# Patient Record
Sex: Female | Born: 1955 | Race: White | Hispanic: No | Marital: Married | Smoking: Former smoker
Health system: Southern US, Community
[De-identification: ages and names within clinical notes are randomized; demographics above are authoritative.]

## PROBLEM LIST (undated history)

## (undated) DIAGNOSIS — IMO0002 Reserved for concepts with insufficient information to code with codable children: Secondary | ICD-10-CM

## (undated) DIAGNOSIS — N979 Female infertility, unspecified: Secondary | ICD-10-CM

## (undated) HISTORY — DX: Reserved for concepts with insufficient information to code with codable children: IMO0002

## (undated) HISTORY — DX: Female infertility, unspecified: N97.9

---

## 1997-02-25 ENCOUNTER — Ambulatory Visit (HOSPITAL_COMMUNITY): Admission: RE | Admit: 1997-02-25 | Discharge: 1997-02-25 | Payer: Self-pay | Admitting: *Deleted

## 1999-01-26 ENCOUNTER — Ambulatory Visit (HOSPITAL_COMMUNITY): Admission: RE | Admit: 1999-01-26 | Discharge: 1999-01-26 | Payer: Self-pay | Admitting: *Deleted

## 1999-01-26 ENCOUNTER — Encounter: Payer: Self-pay | Admitting: *Deleted

## 2000-10-30 ENCOUNTER — Encounter: Payer: Self-pay | Admitting: *Deleted

## 2000-10-30 ENCOUNTER — Ambulatory Visit (HOSPITAL_COMMUNITY): Admission: RE | Admit: 2000-10-30 | Discharge: 2000-10-30 | Payer: Self-pay | Admitting: *Deleted

## 2000-11-01 ENCOUNTER — Encounter: Payer: Self-pay | Admitting: *Deleted

## 2000-11-01 ENCOUNTER — Encounter: Admission: RE | Admit: 2000-11-01 | Discharge: 2000-11-01 | Payer: Self-pay | Admitting: *Deleted

## 2001-03-21 ENCOUNTER — Encounter: Admission: RE | Admit: 2001-03-21 | Discharge: 2001-03-21 | Payer: Self-pay | Admitting: *Deleted

## 2001-03-21 ENCOUNTER — Encounter: Payer: Self-pay | Admitting: *Deleted

## 2002-12-05 ENCOUNTER — Ambulatory Visit (HOSPITAL_COMMUNITY): Admission: RE | Admit: 2002-12-05 | Discharge: 2002-12-05 | Payer: Self-pay

## 2003-12-07 ENCOUNTER — Ambulatory Visit (HOSPITAL_COMMUNITY): Admission: RE | Admit: 2003-12-07 | Discharge: 2003-12-07 | Payer: Self-pay | Admitting: Family Medicine

## 2004-07-25 ENCOUNTER — Other Ambulatory Visit: Admission: RE | Admit: 2004-07-25 | Discharge: 2004-07-25 | Payer: Self-pay | Admitting: Family Medicine

## 2005-01-31 ENCOUNTER — Ambulatory Visit (HOSPITAL_COMMUNITY): Admission: RE | Admit: 2005-01-31 | Discharge: 2005-01-31 | Payer: Self-pay | Admitting: Family Medicine

## 2006-03-26 ENCOUNTER — Ambulatory Visit (HOSPITAL_COMMUNITY): Admission: RE | Admit: 2006-03-26 | Discharge: 2006-03-26 | Payer: Self-pay | Admitting: Family Medicine

## 2006-04-04 ENCOUNTER — Encounter: Admission: RE | Admit: 2006-04-04 | Discharge: 2006-04-04 | Payer: Self-pay | Admitting: Family Medicine

## 2010-01-02 HISTORY — PX: POLYPECTOMY: SHX149

## 2011-06-03 HISTORY — PX: FOOT SURGERY: SHX648

## 2012-01-03 HISTORY — PX: BUNIONECTOMY: SHX129

## 2012-11-01 ENCOUNTER — Ambulatory Visit (INDEPENDENT_AMBULATORY_CARE_PROVIDER_SITE_OTHER): Payer: BC Managed Care – PPO | Admitting: Gynecology

## 2012-11-01 ENCOUNTER — Encounter: Payer: Self-pay | Admitting: Gynecology

## 2012-11-01 VITALS — BP 110/68 | HR 68 | Resp 16 | Wt 124.0 lb

## 2012-11-01 DIAGNOSIS — N952 Postmenopausal atrophic vaginitis: Secondary | ICD-10-CM

## 2012-11-01 DIAGNOSIS — IMO0002 Reserved for concepts with insufficient information to code with codable children: Secondary | ICD-10-CM

## 2012-11-01 MED ORDER — ESTRADIOL 2 MG VA RING: 2.0000 mg | VAGINAL_RING | VAGINAL | Status: DC

## 2012-11-01 NOTE — Progress Notes (Signed)
57 y.o. married Caucasian female   G2P2002 here for annual exam. LMP 2011.  She does report hot flashes, does have night sweats, does have vaginal dryness.  She is using lubricants-astroglide.  She does not report post-menopasual bleeding.  LMP 4y ago, was on hormones-vaginal estrace but issues with compliance but no oral treatments.  Pt is interested in estring.  Patient's last menstrual period was 01/02/2009.          Sexually active: yes  The current method of family planning is vasectomy.    Exercising: yes  teaches fitness class Last pap: 2/14 Abnormal PAP: none Mammogram: 2/14 BSE: done occ Colonoscopy: 8/14 DEXA: 2008 Alcohol: 1 a day Tobacco: none  No health maintenance topics applied.  Family History  Problem Relation Age of Onset  . Cancer Mother     melanoma  . Heart disease Father     There are no active problems to display for this patient.   Past Medical History  Diagnosis Date  . Dyspareunia   . Infertility, female     Past Surgical History  Procedure Laterality Date  . Bunionectomy  2014  . Foot surgery  06/2011    right foot  . Polypectomy  2012    uterine polyp removed    Allergies: Review of patient's allergies indicates no known allergies.  No current outpatient prescriptions on file.   No current facility-administered medications for this visit.    ROS: Pertinent items are noted in HPI.  Exam:    BP 110/68  Pulse 68  Resp 16  Wt 124 lb (56.246 kg)  LMP 01/02/2009 Weight change: @WEIGHTCHANGE @ Last 3 height recordings:  Ht Readings from Last 3 Encounters:  No data found for Ht   General appearance: alert, cooperative and appears stated age Head: Normocephalic, without obvious abnormality, atraumatic Breasts: Inspection negative, No nipple retraction or dimpling, No axillary or supraclavicular adenopathy, Normal to palpation without dominant masses Extremities: left foot in boot due to recent bunion surgery Skin: Skin color, texture,  turgor normal. No rashes or lesions Lymph nodes: Cervical, supraclavicular, and axillary nodes normal. no inguinal nodes palpated Neurologic: Grossly normal   Pelvic: External genitalia:  no lesions              Urethra: normal appearing urethra with no masses, tenderness or lesions              Bartholins and Skenes: normal                 Vagina: atrophic, pale, smooth, no petechiae              Cervix: normal appearance              Pap taken: no        Bimanual Exam:  Uterus:  uterus is normal size, shape, consistency and nontender, retroverted                                      Adnexa:    no masses                                      Rectovaginal: Confirms  Anus:  normal sphincter tone, no lesions  A: well woman Vaginal atrophy dyspareunia no contraindication to begin hormonal therapy      P: discussed alternative to estrace cream including vaginal pills, rings and oral options, in addition recommend EVOO or cocoanut oil. pt prefers the estring, instructed on use Due for annual in 02/2013, will transfer records and reassess medication at that time Discussed PAP guideline changes, importance of weight bearing exercises, calcium, vit D and balanced diet.  An After Visit Summary was printed and given to the patient.

## 2012-11-07 ENCOUNTER — Other Ambulatory Visit: Payer: Self-pay

## 2013-03-26 ENCOUNTER — Ambulatory Visit (INDEPENDENT_AMBULATORY_CARE_PROVIDER_SITE_OTHER): Payer: BC Managed Care – PPO | Admitting: Gynecology

## 2013-03-26 ENCOUNTER — Encounter: Payer: Self-pay | Admitting: Gynecology

## 2013-03-26 VITALS — BP 115/87 | HR 87 | Resp 14 | Ht 63.0 in | Wt 126.0 lb

## 2013-03-26 DIAGNOSIS — N952 Postmenopausal atrophic vaginitis: Secondary | ICD-10-CM

## 2013-03-26 DIAGNOSIS — Z01419 Encounter for gynecological examination (general) (routine) without abnormal findings: Secondary | ICD-10-CM

## 2013-03-26 DIAGNOSIS — IMO0002 Reserved for concepts with insufficient information to code with codable children: Secondary | ICD-10-CM

## 2013-03-26 MED ORDER — ESTRADIOL 2 MG VA RING: 2.0000 mg | VAGINAL_RING | VAGINAL | Status: DC

## 2013-03-26 NOTE — Patient Instructions (Signed)

## 2013-03-26 NOTE — Progress Notes (Signed)
58 y.o. Married Caucasian female   G2P2002 here for annual exam. Pt reports menses are absent due to Menopause.  She does report hot flashes, does have night sweats, does not have vaginal dryness.  She is using lubricants, Estring .  She does not report post-menopasual bleeding.  Pt was started on estring in October 2014, she is very pleased and reports dyspareunia is much better.    Patient's last menstrual period was 01/02/2009.          Sexually active: yes  The current method of family planning is post menopausal status.    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 Pap Smear  11/07/1972  . Tetanus/tdap  11/07/1973  . Colonoscopy  11/07/2004  . Mammogram  04/03/2008  . Influenza Vaccine  08/02/2012    Family History  Problem Relation Age of Onset  . Cancer Mother     melanoma  . Heart disease Father     There are no active problems to display for this patient.   Past Medical History  Diagnosis Date  . Dyspareunia   . Infertility, female     Past Surgical History  Procedure Laterality Date  . Bunionectomy  2014  . Foot surgery  06/2011    right foot  . Polypectomy  2012    uterine polyp removed    Allergies: Review of patient's allergies indicates no known allergies.  Current Outpatient Prescriptions  Medication Sig Dispense Refill  . estradiol (ESTRING) 2 MG vaginal ring Place 2 mg vaginally every 3 (three) months. Insert a new ring into vagina every 3 months  1 each  1   No current facility-administered medications for this visit.    ROS: Pertinent items are noted in HPI.  Exam:    BP 115/87  Pulse 87  Resp 14  Ht 5\' 3"  (1.6 m)  Wt 126 lb (57.153 kg)  BMI 22.33 kg/m2  LMP 01/02/2009 Weight  change: @WEIGHTCHANGE @ Last 3 height recordings:  Ht Readings from Last 3 Encounters:  03/26/13 5\' 3"  (1.6 m)   General appearance: alert, cooperative and appears stated age Head: Normocephalic, without obvious abnormality, atraumatic Neck: no adenopathy, no carotid bruit, no JVD, supple, symmetrical, trachea midline and thyroid not enlarged, symmetric, no tenderness/mass/nodules Lungs: clear to auscultation bilaterally Breasts: normal appearance, no masses or tenderness Heart: regular rate and rhythm, S1, S2 normal, no murmur, click, rub or gallop Abdomen: soft, non-tender; bowel sounds normal; no masses,  no organomegaly Extremities: extremities normal, atraumatic, no cyanosis or edema Skin: Skin color, texture, turgor normal. No rashes or lesions Lymph nodes: Cervical, supraclavicular, and axillary nodes normal. no inguinal nodes palpated Neurologic: Grossly normal   Pelvic: External genitalia:  no lesions              Urethra: normal appearing urethra with no masses, tenderness or lesions              Bartholins and Skenes: Bartholin's, Urethra, Skene's normal                 Vagina: normal appearing vagina with normal color and discharge, no lesions              Cervix: normal appearance  Pap taken: no        Bimanual Exam:  Uterus:  uterus is normal size, shape, consistency and nontender                                      Adnexa:    normal adnexa in size, nontender and no masses                                      Rectovaginal: Confirms                                      Anus:  normal sphincter tone, no lesions  A: well woman no contraindication to continue hormonal therapy     P: mammogram pap smear next year Refill estring counseled on breast self exam, mammography screening, adequate intake of calcium and vitamin D, diet and exercise return annually or prn Discussed PAP guideline changes, importance of weight bearing exercises, calcium, vit D and  balanced diet.  An After Visit Summary was printed and given to the patient.

## 2013-10-06 ENCOUNTER — Other Ambulatory Visit: Payer: Self-pay

## 2013-10-06 DIAGNOSIS — Z1239 Encounter for other screening for malignant neoplasm of breast: Secondary | ICD-10-CM

## 2013-10-16 ENCOUNTER — Ambulatory Visit
Admission: RE | Admit: 2013-10-16 | Discharge: 2013-10-16 | Disposition: A | Discharge status: Home / Self Care | Payer: BC Managed Care – PPO | Source: Ambulatory Visit

## 2013-10-16 DIAGNOSIS — Z1239 Encounter for other screening for malignant neoplasm of breast: Secondary | ICD-10-CM

## 2013-11-03 ENCOUNTER — Encounter: Payer: Self-pay | Admitting: Gynecology

## 2013-12-02 ENCOUNTER — Telehealth: Payer: Self-pay

## 2013-12-02 NOTE — Telephone Encounter (Signed)
lmtcb to reschedule AEX with Dr. Lathrop 

## 2014-04-01 ENCOUNTER — Ambulatory Visit: Payer: BC Managed Care – PPO | Admitting: Gynecology

## 2014-04-02 ENCOUNTER — Encounter: Payer: Self-pay | Admitting: Nurse Practitioner

## 2014-04-02 ENCOUNTER — Ambulatory Visit (INDEPENDENT_AMBULATORY_CARE_PROVIDER_SITE_OTHER): Payer: BLUE CROSS/BLUE SHIELD | Admitting: Nurse Practitioner

## 2014-04-02 VITALS — BP 112/76 | HR 64 | Ht 63.25 in | Wt 128.0 lb

## 2014-04-02 DIAGNOSIS — Z01419 Encounter for gynecological examination (general) (routine) without abnormal findings: Secondary | ICD-10-CM | POA: Diagnosis not present

## 2014-04-02 DIAGNOSIS — N952 Postmenopausal atrophic vaginitis: Secondary | ICD-10-CM

## 2014-04-02 DIAGNOSIS — Z0000 Encounter for general adult medical examination without abnormal findings: Secondary | ICD-10-CM

## 2014-04-02 DIAGNOSIS — IMO0002 Reserved for concepts with insufficient information to code with codable children: Secondary | ICD-10-CM

## 2014-04-02 DIAGNOSIS — N941 Dyspareunia: Secondary | ICD-10-CM | POA: Diagnosis not present

## 2014-04-02 LAB — POCT URINALYSIS DIPSTICK
BILIRUBIN UA: NEGATIVE
Blood, UA: NEGATIVE
GLUCOSE UA: NEGATIVE
KETONES UA: NEGATIVE
LEUKOCYTES UA: NEGATIVE
Nitrite, UA: NEGATIVE
Protein, UA: NEGATIVE
Urobilinogen, UA: NEGATIVE
pH, UA: 5.5

## 2014-04-02 MED ORDER — ESTRADIOL 2 MG VA RING: 2.0000 mg | VAGINAL_RING | VAGINAL | Status: DC

## 2014-04-02 NOTE — Progress Notes (Signed)
Patient ID: Hannah Ewing, female   DOB: 12/27/1955, 59 y.o.   MRN: [id number] 59 y.o. 582P2002 Married  Caucasian Fe here for annual exam.  Does well with Estring and wants to continue.  Patient's last menstrual period was 01/02/2009.          Sexually active: Yes.    The current method of family planning is post menopausal status and vasectomy.    Exercising: Yes.    Pt gets exercise everyday by playing tennis, cardio, weights, teaches exercise classes at [employer] Smoker:  no  Health Maintenance: Pap:  02/2012, negative, no history of abnormal pap MMG:  10/16/13, Bi-Rads 1:  Negative Colonoscopy:  08/2012, normal, repeat in 10 years BMD:   04/2013, normal (part of alzheimer's research study) TDaP:  ? Labs:  HB:  13.3  Urine:  Negative    reports that she has quit smoking. She does not have any smokeless tobacco history on file. She reports that she drinks about 3.5 oz of alcohol per week. She reports that she does not use illicit drugs.  Past Medical History  Diagnosis Date  . Dyspareunia   . Infertility, female     Past Surgical History  Procedure Laterality Date  . Bunionectomy  2014  . Foot surgery  06/2011    right foot  . Polypectomy  2012    uterine polyp removed    Current Outpatient Prescriptions  Medication Sig Dispense Refill  . estradiol (ESTRING) 2 MG vaginal ring Place 2 mg vaginally every 3 (three) months. Insert a new ring into vagina every 3 months 1 each 3   No current facility-administered medications for this visit.    Family History  Problem Relation Age of Onset  . Cancer Mother     melanoma  . Hyperlipidemia Mother   . Hypertension Mother   . Heart disease Father     CABG x 3  . Heart disease Maternal Grandmother   . Heart attack Maternal Grandmother     x 2  . AAA (abdominal aortic aneurysm) Brother   . Arrhythmia Brother     ablation  . Hyperlipidemia Sister   . Hypertension Sister     ROS:  Pertinent items are noted in HPI.  Otherwise, a  comprehensive ROS was negative.  Exam:   BP 112/76 mmHg  Pulse 64  Ht 5' 3.25" (1.607 m)  Wt 128 lb (58.06 kg)  BMI 22.48 kg/m2  LMP 01/02/2009 Height: 5' 3.25" (160.7 cm) Ht Readings from Last 3 Encounters:  04/02/14 5' 3.25" (1.607 m)  03/26/13 5\' 3"  (1.6 m)    General appearance: alert, cooperative and appears stated age Head: Normocephalic, without obvious abnormality, atraumatic Neck: no adenopathy, supple, symmetrical, trachea midline and thyroid normal to inspection and palpation Lungs: clear to auscultation bilaterally Breasts: normal appearance, no masses or tenderness Heart: regular rate and rhythm Abdomen: soft, non-tender; no masses,  no organomegaly Extremities: extremities normal, atraumatic, no cyanosis or edema Skin: Skin color, texture, turgor normal. No rashes or lesions Lymph nodes: Cervical, supraclavicular, and axillary nodes normal. No abnormal inguinal nodes palpated Neurologic: Grossly normal   Pelvic: External genitalia:  no lesions              Urethra:  normal appearing urethra with no masses, tenderness or lesions              Bartholin's and Skene's: normal                 Vagina:  normal appearing vagina with normal color and discharge, no lesions              Cervix: anteverted              Pap taken: No. Bimanual Exam:  Uterus:  normal size, contour, position, consistency, mobility, non-tender              Adnexa: no mass, fullness, tenderness               Rectovaginal: Confirms               Anus:  normal sphincter tone, no lesions  Chaperone present:  yes  A:  Well Woman with normal exam  Postmenopausal - no HRT  Atrophic vaginitis - does well on Estring  [facility] for heart disease   P:   Reviewed health and wellness pertinent to exam  Pap smear not taken today  Mammogram is due 10/16  Refill on Estring for a year  Counseled with risk of DVT, CVA, cancer, etc  Counseled on breast self exam, mammography screening, adequate intake of  calcium and vitamin D, diet and exercise return annually or prn  An After Visit Summary was printed and given to the patient  She will try to get BMD results to [place] from last year.

## 2014-04-02 NOTE — Patient Instructions (Signed)

## 2014-04-02 NOTE — Progress Notes (Signed)
Encounter reviewed by Dr. Layani Foronda Silva.  

## 2014-04-03 LAB — VITAMIN D 25 HYDROXY (VIT D DEFICIENCY, FRACTURES): VIT D 25 HYDROXY: 39 ng/mL (ref 30–100)

## 2014-04-03 LAB — HEMOGLOBIN, FINGERSTICK: Hemoglobin, fingerstick: 13.3 g/dL (ref 12.0–16.0)

## 2015-03-29 ENCOUNTER — Other Ambulatory Visit: Payer: Self-pay

## 2015-03-29 DIAGNOSIS — Z1231 Encounter for screening mammogram for malignant neoplasm of breast: Secondary | ICD-10-CM

## 2015-04-05 ENCOUNTER — Ambulatory Visit (INDEPENDENT_AMBULATORY_CARE_PROVIDER_SITE_OTHER): Payer: BLUE CROSS/BLUE SHIELD | Admitting: Nurse Practitioner

## 2015-04-05 ENCOUNTER — Encounter: Payer: Self-pay | Admitting: Nurse Practitioner

## 2015-04-05 VITALS — BP 120/78 | HR 64 | Ht 63.0 in | Wt 126.0 lb

## 2015-04-05 DIAGNOSIS — R8290 Unspecified abnormal findings in urine: Secondary | ICD-10-CM | POA: Diagnosis not present

## 2015-04-05 DIAGNOSIS — Z0000 Encounter for general adult medical examination without abnormal findings: Secondary | ICD-10-CM | POA: Diagnosis not present

## 2015-04-05 DIAGNOSIS — N952 Postmenopausal atrophic vaginitis: Secondary | ICD-10-CM

## 2015-04-05 DIAGNOSIS — Z01419 Encounter for gynecological examination (general) (routine) without abnormal findings: Secondary | ICD-10-CM | POA: Diagnosis not present

## 2015-04-05 LAB — HIV ANTIBODY (ROUTINE TESTING W REFLEX): HIV 1&2 Ab, 4th Generation: NONREACTIVE

## 2015-04-05 LAB — HEPATITIS C ANTIBODY: HCV Ab: NEGATIVE

## 2015-04-05 LAB — POCT URINALYSIS DIPSTICK
BILIRUBIN UA: NEGATIVE
Blood, UA: NEGATIVE
Glucose, UA: NEGATIVE
KETONES UA: NEGATIVE
Nitrite, UA: NEGATIVE
Protein, UA: NEGATIVE
Urobilinogen, UA: NEGATIVE
pH, UA: 6

## 2015-04-05 MED ORDER — ESTRADIOL 0.1 MG/GM VA CREA: TOPICAL_CREAM | VAGINAL | Status: DC

## 2015-04-05 NOTE — Progress Notes (Signed)
Patient ID: Hannah Ewing, female   DOB: 05/21/1955, 60 y.o.   MRN: [id number]  60 y.o. 732P2002 Married  Caucasian Fe here for annual exam.  Some vaso symptoms that are tolerable. Overall feels well and she and husband travel a lot.  Denies urinary symptoms.  Patient's last menstrual period was 01/02/2009 (approximate).          Sexually active: Yes.    The current method of family planning is vasectomy.    Exercising: Yes.  tennis, weight training and cardio Smoker:  no  Health Maintenance: Pap: 02/2012, negative, no history of abnormal pap MMG: 10/16/13, Bi-Rads 1: Negative, apt scheduled 04/14/15 Colonoscopy: 08/2012, normal, repeat in 10 years BMD: 04/2013, normal (part of Alzheimer's research study) TDaP: ?, over 10 years Shingles: Never Pneumonia: Not indicated due to age Hep C and HIV: done today Labs: HB: 13.7  Urine:  1+ leuk's   reports that she has quit smoking. She has never used smokeless tobacco. She reports that she drinks about 2.4 - 3.0 oz of alcohol per week. She reports that she does not use illicit drugs.  Past Medical History  Diagnosis Date  . Dyspareunia   . Infertility, female     Past Surgical History  Procedure Laterality Date  . Bunionectomy  2014  . Foot surgery  06/2011    right foot  . Polypectomy  2012    uterine polyp removed    Current Outpatient Prescriptions  Medication Sig Dispense Refill  . estradiol (ESTRACE) 0.1 MG/GM vaginal cream Use 1/2 g vaginally and pea size amount as directed 42.5 g 3   No current facility-administered medications for this visit.    Family History  Problem Relation Age of Onset  . Cancer Mother     melanoma  . Hyperlipidemia Mother   . Hypertension Mother   . Heart disease Father     CABG x 3  . Heart disease Maternal Grandmother   . Heart attack Maternal Grandmother     x 2  . AAA (abdominal aortic aneurysm) Brother   . Arrhythmia Brother     ablation  . Hyperlipidemia Sister   . Hypertension  Sister     ROS:  Pertinent items are noted in HPI.  Otherwise, a comprehensive ROS was negative.  Exam:   BP 120/78 mmHg  Pulse 64  Ht 5\' 3"  (1.6 m)  Wt 126 lb (57.153 kg)  BMI 22.33 kg/m2  LMP 01/02/2009 (Approximate) Height: 5\' 3"  (160 cm) Ht Readings from Last 3 Encounters:  04/05/15 5\' 3"  (1.6 m)  04/02/14 5' 3.25" (1.607 m)  03/26/13 5\' 3"  (1.6 m)    General appearance: alert, cooperative and appears stated age Head: Normocephalic, without obvious abnormality, atraumatic Neck: no adenopathy, supple, symmetrical, trachea midline and thyroid normal to inspection and palpation Lungs: clear to auscultation bilaterally Breasts: normal appearance, no masses or tenderness Heart: regular rate and rhythm Abdomen: soft, non-tender; no masses,  no organomegaly Extremities: extremities normal, atraumatic, no cyanosis or edema Skin: Skin color, texture, turgor normal. No rashes or lesions Lymph nodes: Cervical, supraclavicular, and axillary nodes normal. No abnormal inguinal nodes palpated Neurologic: Grossly normal   Pelvic: External genitalia:  no lesions              Urethra:  atrophic appearing urethra with no masses, tenderness or lesions              Bartholin's and Skene's: normal  Vagina: very atrophic appearing vagina with normal color and discharge, no lesions              Cervix: anteverted              Pap taken: Yes.   Bimanual Exam:  Uterus:  normal size, contour, position, consistency, mobility, non-tender              Adnexa: no mass, fullness, tenderness               Rectovaginal: Confirms               Anus:  normal sphincter tone, no lesions  Chaperone present: yes  A:  Well Woman with normal exam  Postmenopausal - no HRT Atrophic vaginitis - now off Estring X 6 months [facility] for heart disease  R/O UTI   P:   Reviewed health and wellness pertinent to exam  Pap smear as above  Mammogram is due and scheduled for  04/14/15  Follow with labs and urine culture  She is given new Rx for Estrace vaginal cream to use only pea size amount to the urethra and intravaginally 1-2 times per week.  Counseled with risk of CVA, DVT, cancer,etc.  Counseled on breast self exam, mammography screening, adequate intake of calcium and vitamin D, diet and exercise, Kegel's exercises return annually or prn  An After Visit Summary was printed and given to the patient.

## 2015-04-05 NOTE — Patient Instructions (Signed)

## 2015-04-07 ENCOUNTER — Other Ambulatory Visit: Payer: Self-pay | Admitting: Nurse Practitioner

## 2015-04-07 LAB — URINE CULTURE

## 2015-04-07 LAB — IPS PAP TEST WITH HPV

## 2015-04-07 MED ORDER — NITROFURANTOIN MONOHYD MACRO 100 MG PO CAPS: 100.0000 mg | ORAL_CAPSULE | Freq: Two times a day (BID) | ORAL | Status: DC

## 2015-04-09 LAB — HEMOGLOBIN, FINGERSTICK: HEMOGLOBIN, FINGERSTICK: 13.7 g/dL (ref 12.0–16.0)

## 2015-04-11 NOTE — Progress Notes (Signed)
Reviewed personally.  M. Suzanne Consuello Lassalle, MD.  

## 2015-04-14 ENCOUNTER — Ambulatory Visit
Admission: RE | Admit: 2015-04-14 | Discharge: 2015-04-14 | Disposition: A | Discharge status: Home / Self Care | Payer: BLUE CROSS/BLUE SHIELD | Source: Ambulatory Visit

## 2015-04-14 DIAGNOSIS — Z1231 Encounter for screening mammogram for malignant neoplasm of breast: Secondary | ICD-10-CM

## 2016-04-14 ENCOUNTER — Other Ambulatory Visit: Payer: Self-pay | Admitting: Nurse Practitioner

## 2016-04-14 DIAGNOSIS — Z1231 Encounter for screening mammogram for malignant neoplasm of breast: Secondary | ICD-10-CM

## 2016-04-17 ENCOUNTER — Ambulatory Visit (INDEPENDENT_AMBULATORY_CARE_PROVIDER_SITE_OTHER): Payer: BLUE CROSS/BLUE SHIELD | Admitting: Nurse Practitioner

## 2016-04-17 ENCOUNTER — Encounter: Payer: Self-pay | Admitting: Nurse Practitioner

## 2016-04-17 VITALS — BP 100/62 | HR 52 | Ht 63.0 in | Wt 124.0 lb

## 2016-04-17 DIAGNOSIS — Z01411 Encounter for gynecological examination (general) (routine) with abnormal findings: Secondary | ICD-10-CM

## 2016-04-17 DIAGNOSIS — E2839 Other primary ovarian failure: Secondary | ICD-10-CM

## 2016-04-17 DIAGNOSIS — N952 Postmenopausal atrophic vaginitis: Secondary | ICD-10-CM

## 2016-04-17 DIAGNOSIS — Z0000 Encounter for general adult medical examination without abnormal findings: Secondary | ICD-10-CM

## 2016-04-17 NOTE — Patient Instructions (Signed)

## 2016-04-17 NOTE — Progress Notes (Signed)
61 y.o. G25P2002 Married  Caucasian Fe here for annual exam.  Now off Estrogen vaginal cream as it was not helping.  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  Patient's last menstrual period was 01/02/2009 (approximate).          Sexually active: Yes.    The current method of family planning is vasectomy.    Exercising: Yes.    Fitness instructor Smoker:  no  Health Maintenance: Pap: 04/05/15, Negative with neg HR HPV  02/2012, Negative History of Abnormal Pap: No MMG:04/14/15, 3D, Bi-Rads 1:Negative Colonoscopy:08/2012, normal, repeat in 10 years BMD:05/19/13, normal (report is sent to be scanned) but will repeat a traditional one this year TDaP: ?, over 10 years Shingles: Never does not think she wants this at all Hep C and HIV: 04/05/15 Labs: fasting labs today   reports that she has quit smoking. She has never used smokeless tobacco. She reports that she drinks about 2.4 - 3.0 oz of alcohol per week . She reports that she does not use drugs.  Past Medical History:  Diagnosis Date  . Dyspareunia   . Infertility, female     Past Surgical History:  Procedure Laterality Date  . BUNIONECTOMY  2014  . FOOT SURGERY  06/2011   right foot  . POLYPECTOMY  2012   uterine polyp removed    No current outpatient prescriptions on file.   No current facility-administered medications for this visit.     Family History  Problem Relation Age of Onset  . Cancer Mother     melanoma  . Hyperlipidemia Mother   . Hypertension Mother   . Heart disease Father     CABG x 3  . Heart disease Maternal Grandmother   . Heart attack Maternal Grandmother     x 2  . AAA (abdominal aortic aneurysm) Brother   . Arrhythmia Brother     ablation  . Hyperlipidemia Sister   . Hypertension Sister     ROS:  Pertinent items are noted in HPI.  Otherwise, a comprehensive ROS was negative.  Exam:   BP 100/62 (BP Location: Right Arm, Patient  Position: Sitting, Cuff Size: Normal)   Pulse (!) 52   Ht  (1.6 m)   Wt 124 lb (56.2 kg)   LMP 01/02/2009 (Approximate)   BMI 21.97 kg/m  Height:  (160 cm) Ht Readings from Last 3 Encounters:  04/17/16  (1.6 m)  04/05/15  (1.6 m)  04/02/14 5' 3.25" (1.607 m)    General appearance: alert, cooperative and appears stated age Head: Normocephalic, without obvious abnormality, atraumatic Neck: no adenopathy, supple, symmetrical, trachea midline and thyroid normal to inspection and palpation Lungs: clear to auscultation bilaterally Breasts: normal appearance, no masses or tenderness Heart: regular rate and rhythm Abdomen: soft, non-tender; no masses,  no organomegaly Extremities: extremities normal, atraumatic, no cyanosis or edema Skin: Skin color, texture, turgor normal. No rashes or lesions Lymph nodes: Cervical, supraclavicular, and axillary nodes normal. No abnormal inguinal nodes palpated Neurologic: Grossly normal   Pelvic: External genitalia:  no lesions              Urethra:  normal appearing urethra with no masses, tenderness or lesions              Bartholin's and Skene's: normal                 Vagina:  normal appearing vagina with normal color and discharge, no lesions              Cervix: anteverted              Pap taken: No. Bimanual Exam:  Uterus:  normal size, contour, position, consistency, mobility, non-tender              Adnexa: no mass, fullness, tenderness               Rectovaginal: Confirms               Anus:  normal sphincter tone, no lesions  Chaperone present: yes  A:  Well Woman with normal exam  Postmenopausal - no HRT Atrophic vaginitis - now off vaginal estrogen [facility] for heart disease               P:   Reviewed health and wellness pertinent to exam  Pap smear not done  Mammogram is due & scheduled for 05/10/16  Will get BMD at same time  Follow with labs  Counseled on breast self exam, mammography  screening, adequate intake of calcium and vitamin D, diet and exercise return annually or prn  An After Visit Summary was printed and given to the patient.

## 2016-04-18 LAB — COMPREHENSIVE METABOLIC PANEL
ALBUMIN: 4.5 g/dL (ref 3.6–5.1)
ALT: 14 U/L (ref 6–29)
AST: 27 U/L (ref 10–35)
Alkaline Phosphatase: 52 U/L (ref 33–130)
BILIRUBIN TOTAL: 1.2 mg/dL (ref 0.2–1.2)
BUN: 15 mg/dL (ref 7–25)
CALCIUM: 9.8 mg/dL (ref 8.6–10.4)
CO2: 22 mmol/L (ref 20–31)
Chloride: 100 mmol/L (ref 98–110)
Creat: 0.79 mg/dL (ref 0.50–0.99)
Glucose, Bld: 99 mg/dL (ref 65–99)
POTASSIUM: 4.3 mmol/L (ref 3.5–5.3)
Sodium: 139 mmol/L (ref 135–146)
Total Protein: 6.8 g/dL (ref 6.1–8.1)

## 2016-04-18 LAB — TSH: TSH: 2.23 m[IU]/L

## 2016-04-18 LAB — LIPID PANEL
CHOL/HDL RATIO: 2.5 ratio (ref <5.0)
Cholesterol: 250 mg/dL — ABNORMAL HIGH (ref <200)
HDL: 99 mg/dL (ref >50)
LDL CALC: 135 mg/dL — AB (ref <100)
TRIGLYCERIDES: 79 mg/dL (ref <150)
VLDL: 16 mg/dL (ref <30)

## 2016-04-18 LAB — VITAMIN D 25 HYDROXY (VIT D DEFICIENCY, FRACTURES): Vit D, 25-Hydroxy: 30 ng/mL (ref 30–100)

## 2016-04-20 NOTE — Progress Notes (Signed)
Encounter reviewed by Dr. Calise Dunckel Amundson C. Silva.  

## 2016-05-10 ENCOUNTER — Ambulatory Visit: Payer: BLUE CROSS/BLUE SHIELD

## 2016-05-10 ENCOUNTER — Ambulatory Visit
Admission: RE | Admit: 2016-05-10 | Discharge: 2016-05-10 | Disposition: A | Discharge status: Home / Self Care | Payer: BLUE CROSS/BLUE SHIELD | Source: Ambulatory Visit | Attending: Nurse Practitioner | Admitting: Nurse Practitioner

## 2016-05-10 DIAGNOSIS — Z1231 Encounter for screening mammogram for malignant neoplasm of breast: Secondary | ICD-10-CM

## 2016-05-10 DIAGNOSIS — E2839 Other primary ovarian failure: Secondary | ICD-10-CM

## 2016-05-18 NOTE — Progress Notes (Unsigned)
Entered in error

## 2016-08-01 ENCOUNTER — Telehealth: Payer: Self-pay | Admitting: Obstetrics & Gynecology

## 2016-08-01 NOTE — Telephone Encounter (Signed)
Left message for patient to call and reschedule Patty Grubb appointment °

## 2017-04-18 ENCOUNTER — Ambulatory Visit: Payer: BLUE CROSS/BLUE SHIELD | Admitting: Nurse Practitioner

## 2018-09-26 IMAGING — MG 2D DIGITAL SCREENING BILATERAL MAMMOGRAM WITH CAD AND ADJUNCT TO
9 of 12 series · 9 of 28 positions shown · non-contrast
Comparison: Previous exam(s).

CLINICAL DATA: Screening.

EXAM:
2D DIGITAL SCREENING BILATERAL MAMMOGRAM WITH CAD AND ADJUNCT TOMO

[L MLO synth-2D]
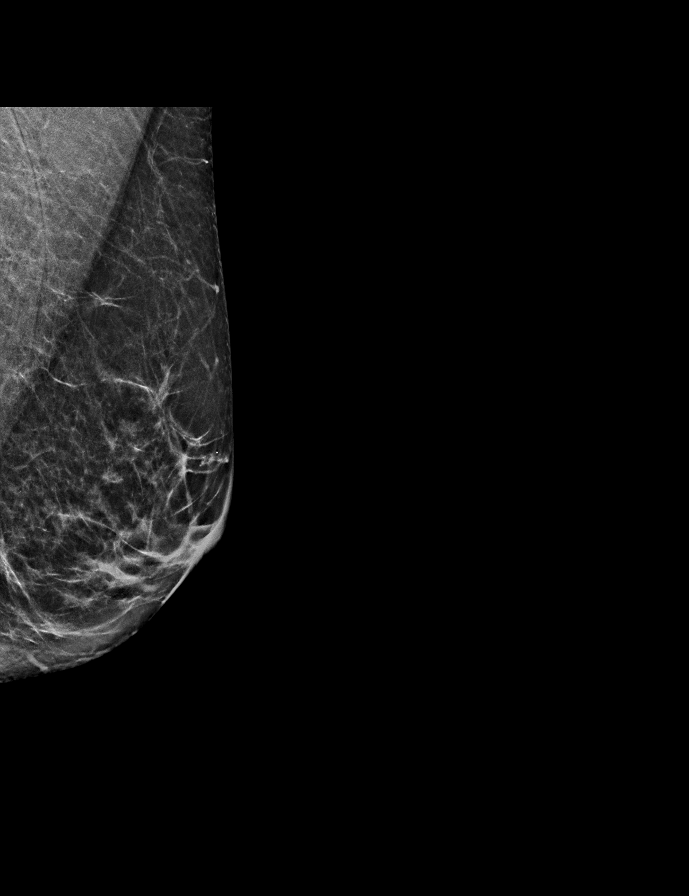

[L MLO]
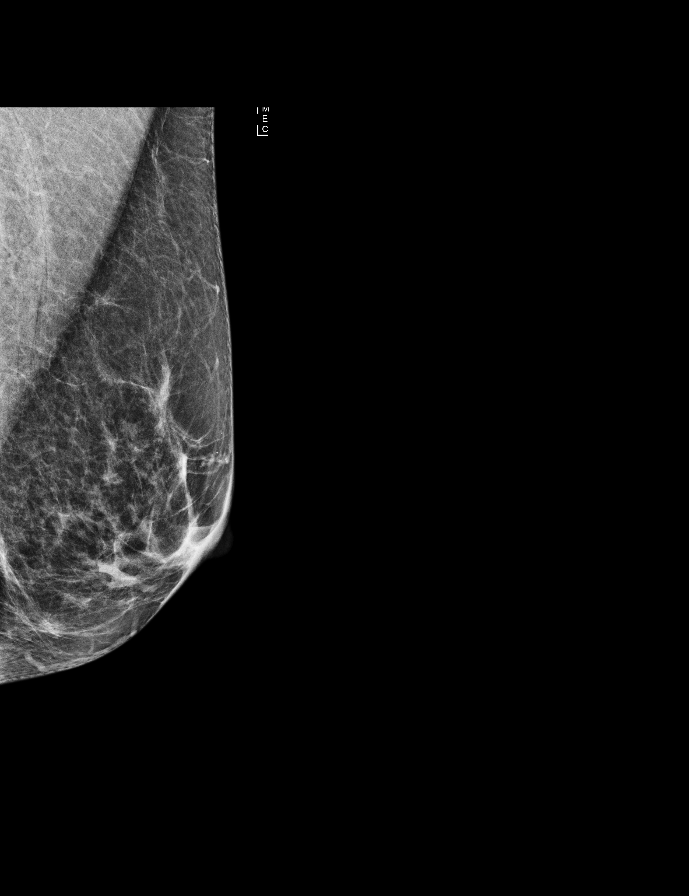

[L CC]
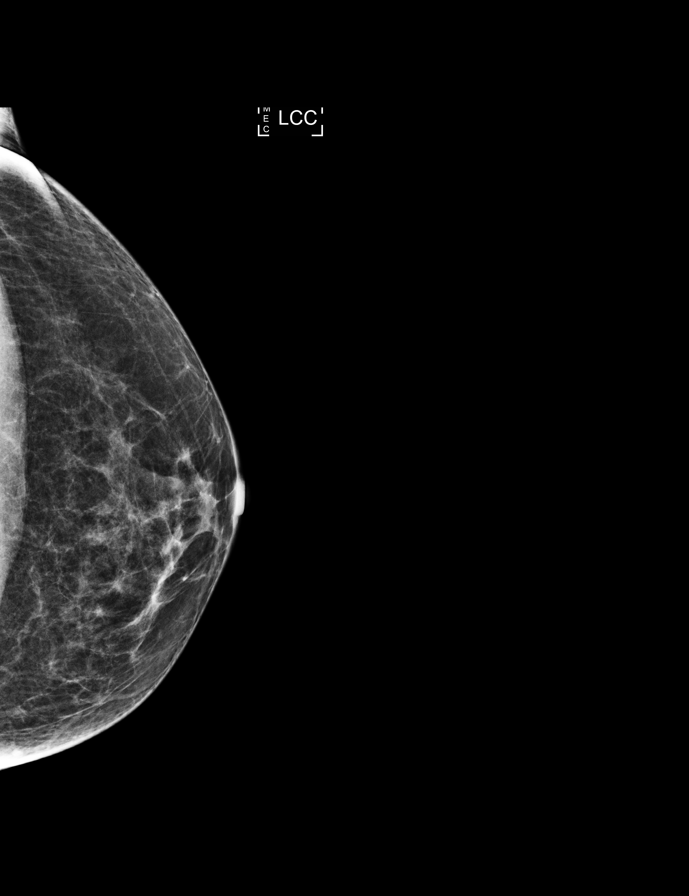

[R MLO synth-2D]
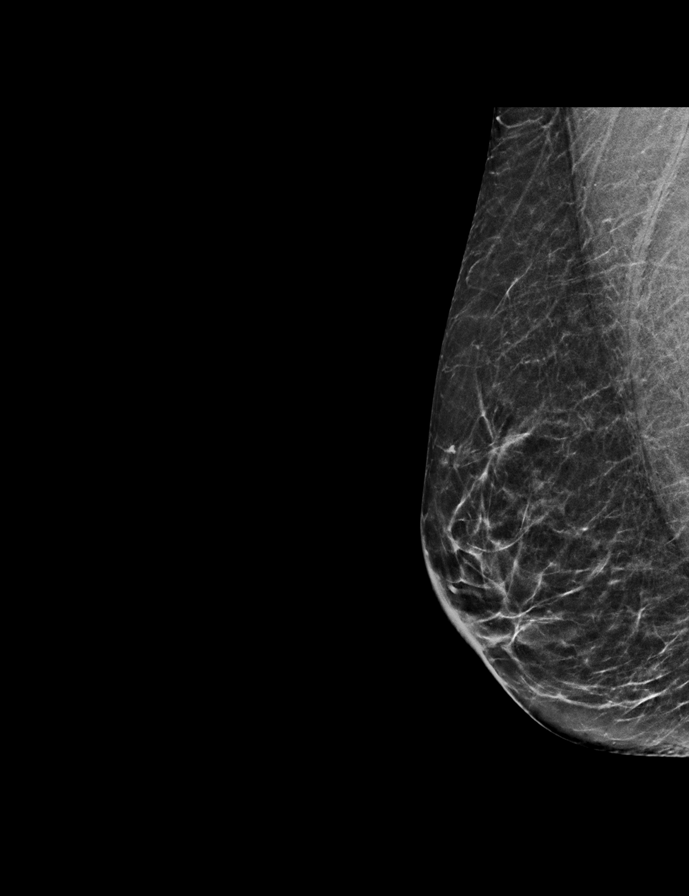

[R CC]
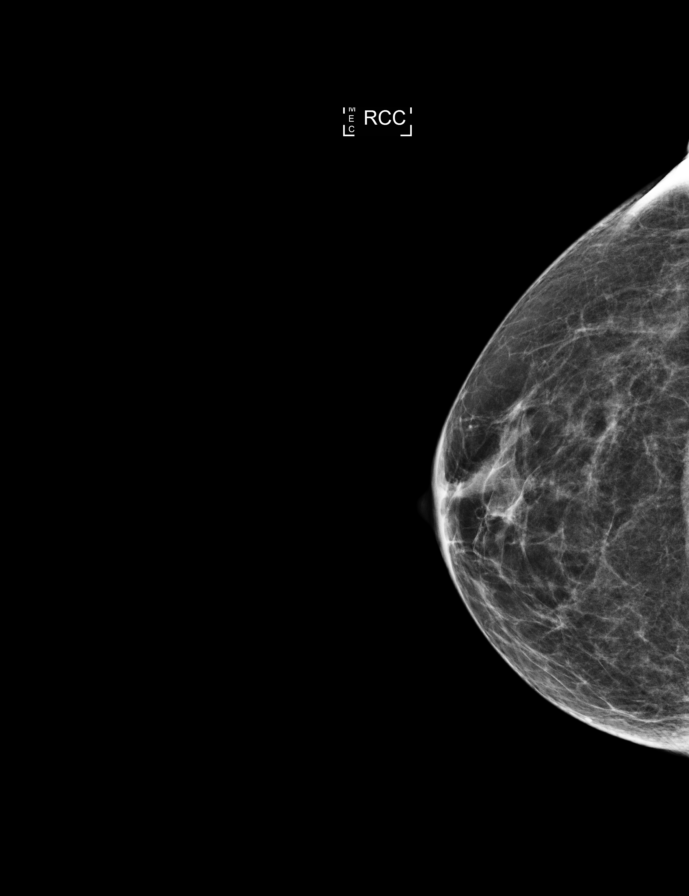

[L CC synth-2D]
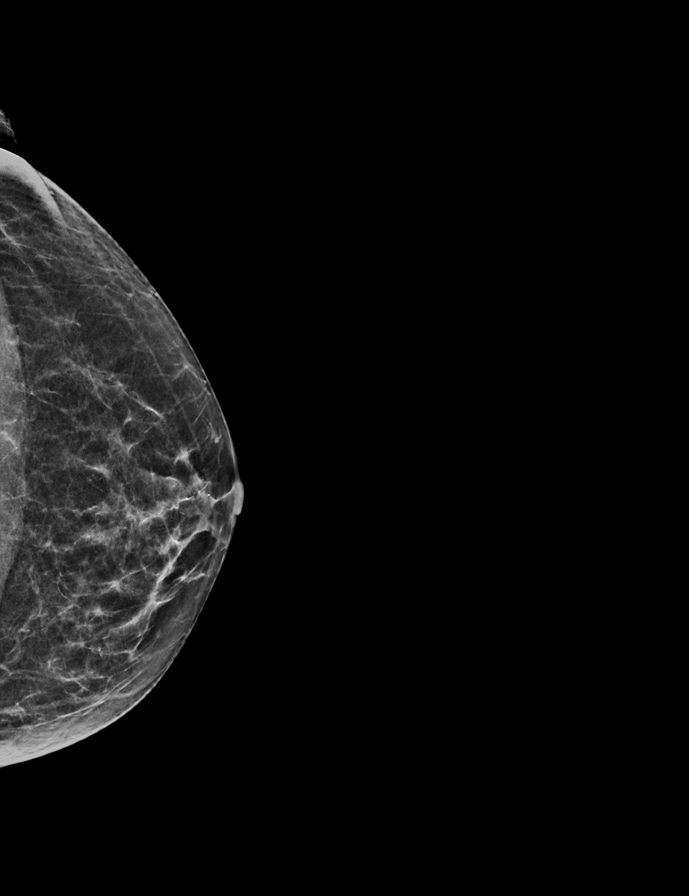

[R CC synth-2D]
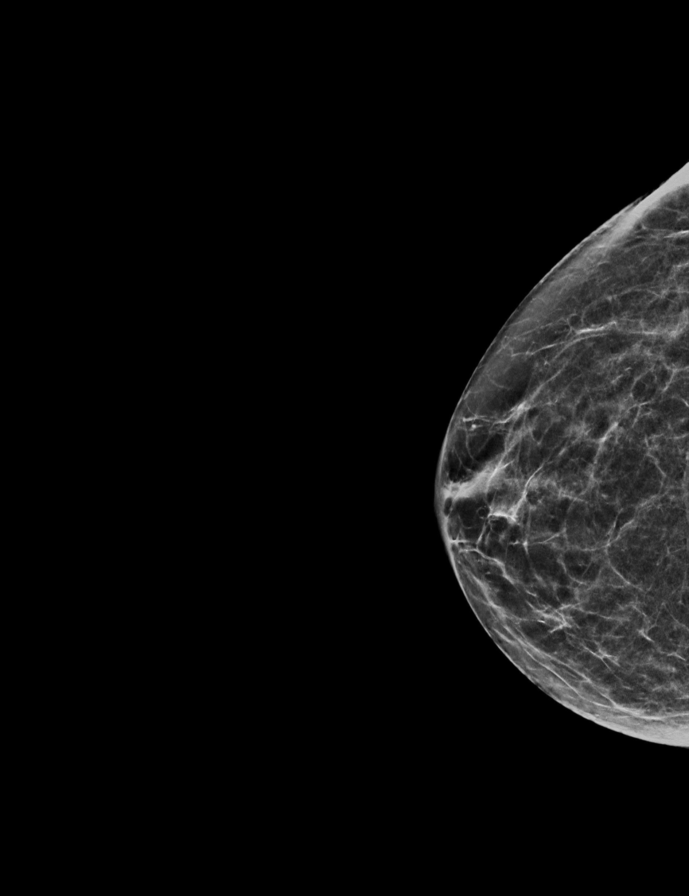

[R MLO]
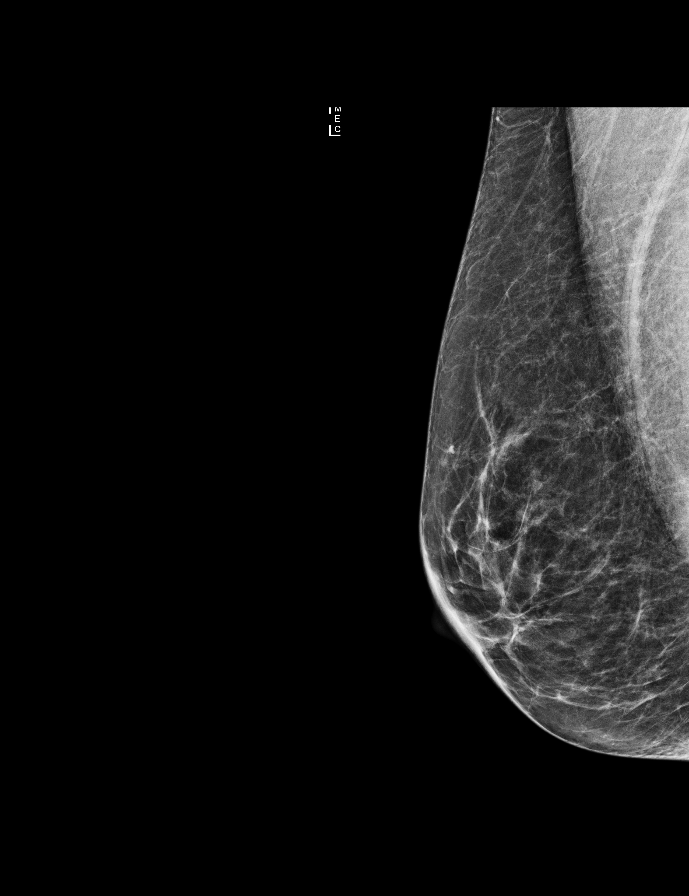

[R CC tomo · tomo slice 25/50.0]
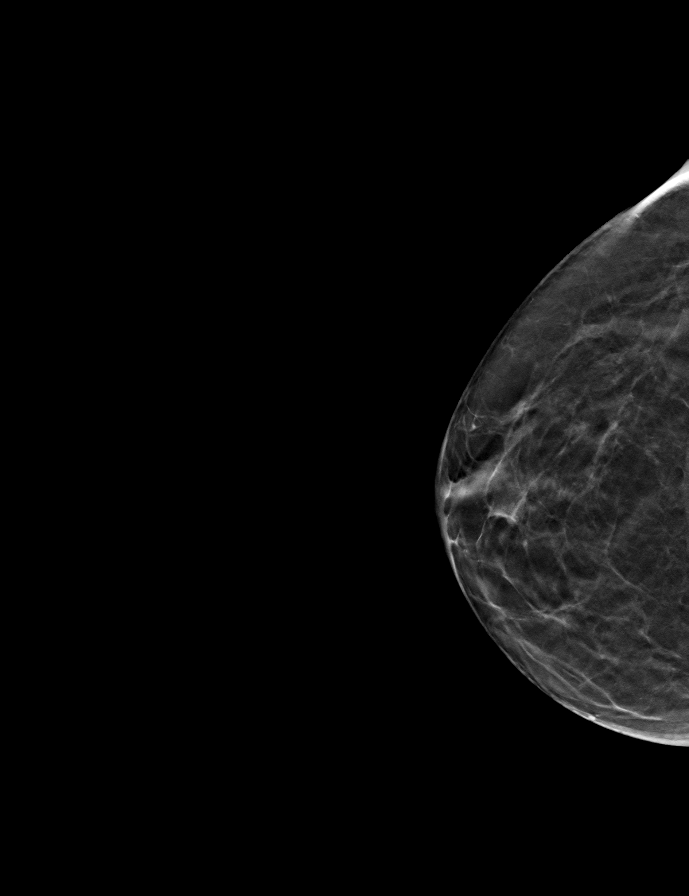

[9 of 28 positions shown; findings below may reference images not displayed]

ACR Breast Density Category b: There are scattered areas of
fibroglandular density.
FINDINGS: There are no findings suspicious for malignancy. Images were
processed with CAD.
IMPRESSION: No mammographic evidence of malignancy. A result letter of this
screening mammogram will be mailed directly to the patient.

RECOMMENDATION:
Screening mammogram in one year. (Code:97-6-RS4)

BI-RADS CATEGORY  1: Negative.
